# Patient Record
Sex: Female | Born: 1995 | Hispanic: Yes | Marital: Single
Health system: Southern US, Community
[De-identification: ages and names within clinical notes are randomized; demographics above are authoritative.]

---
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
## 2014-05-16 IMAGING — US US OB DETAIL+14 WK - NRPT [facility]
1 series · 14 of 28 positions shown · non-contrast
Comparison: none

[Series 1: us ob detail+14 wk - nrpt [facility] · 0.26mm/px · 14 of 91 slices shown]
[im 4/91]
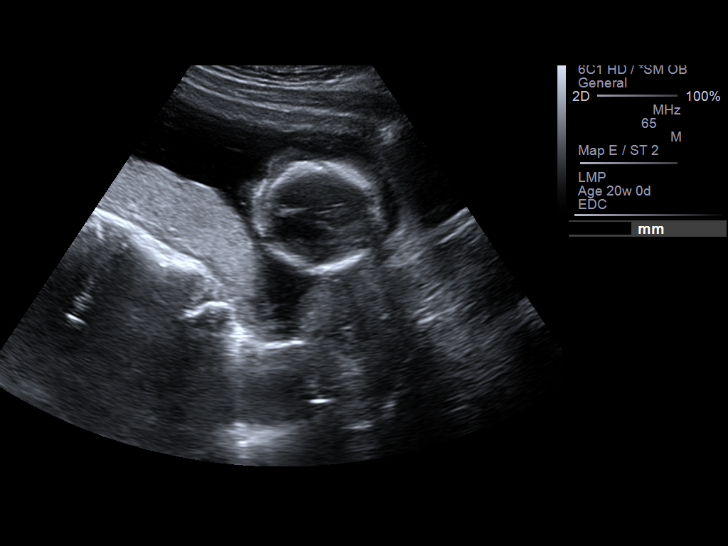
[im 11/91]
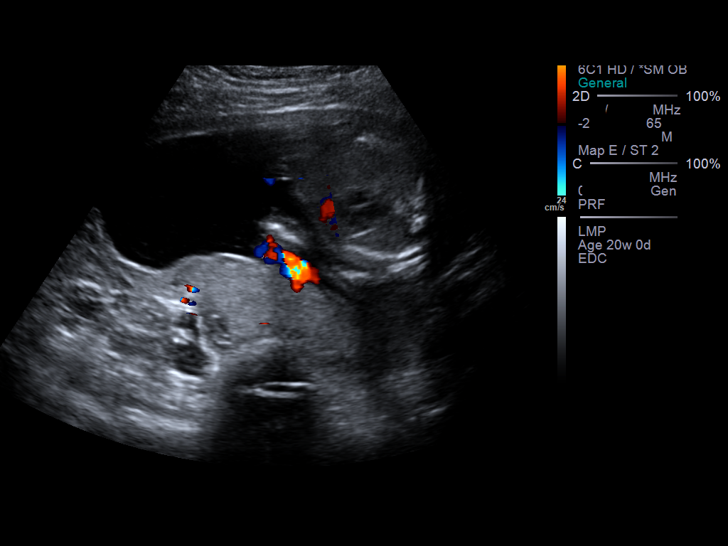
[im 17/91]
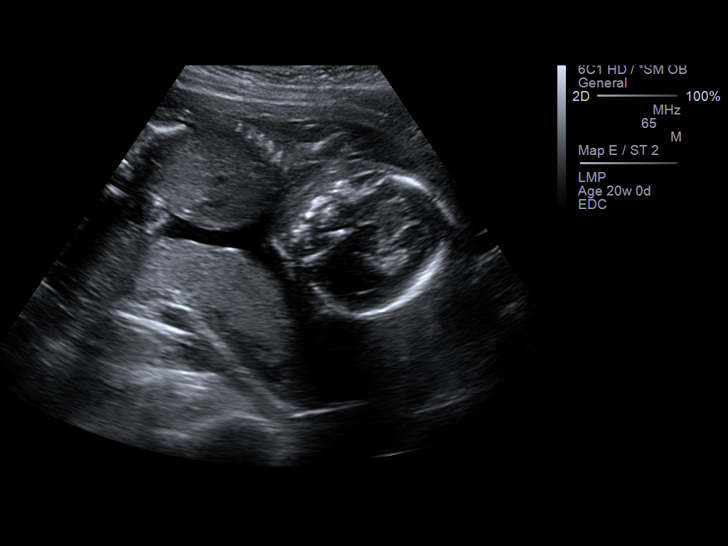
[im 24/91]
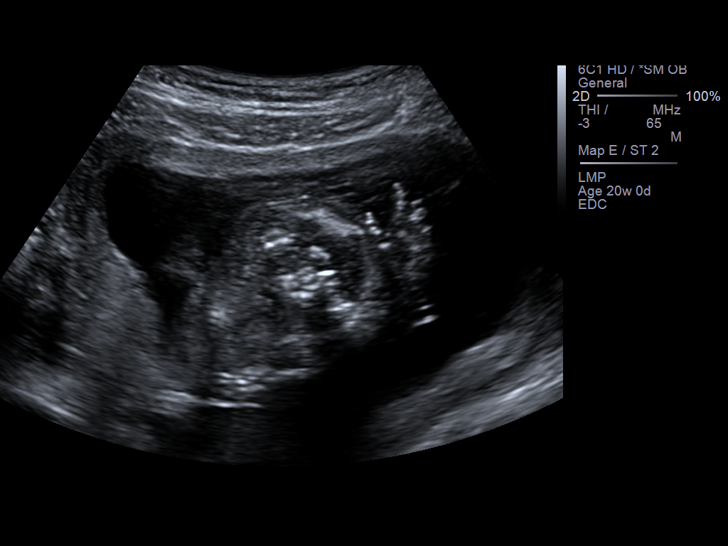
[im 31/91]
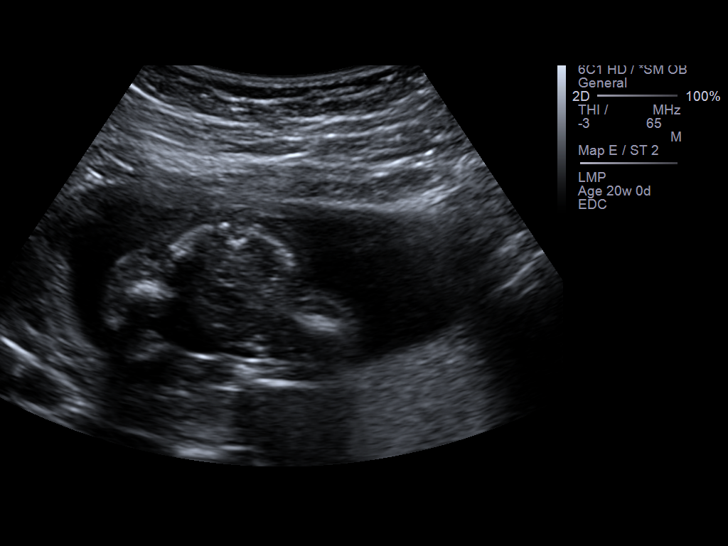
[im 37/91]
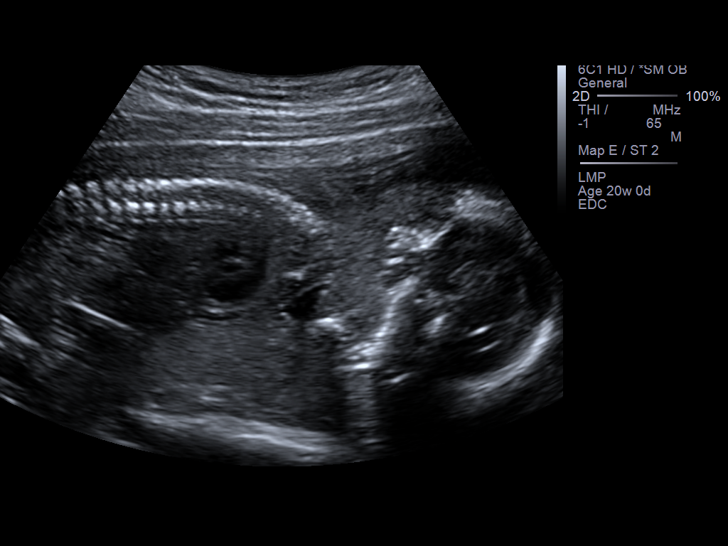
[im 44/91]
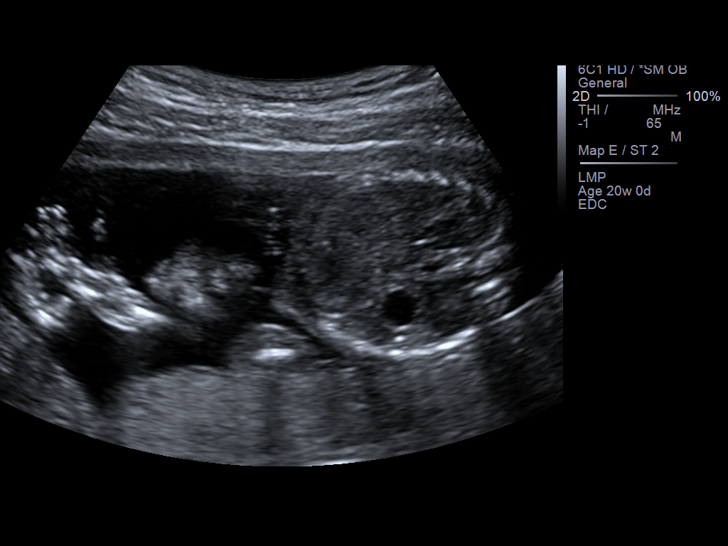
[im 51/91]
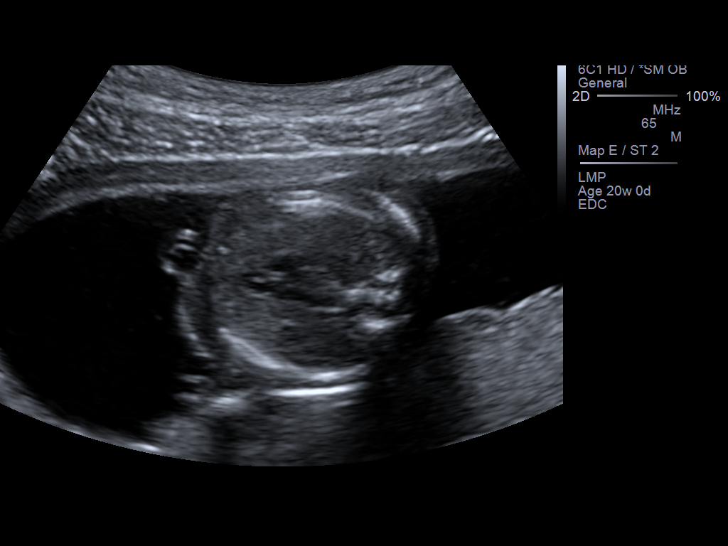
[im 57/91]
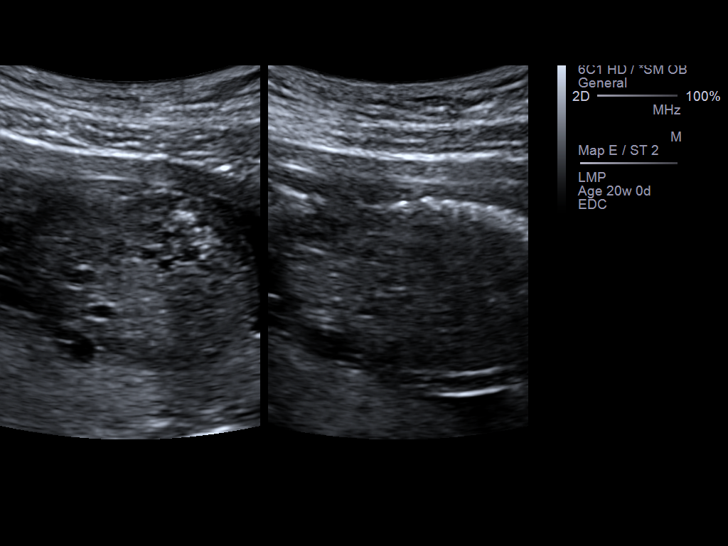
[im 64/91]
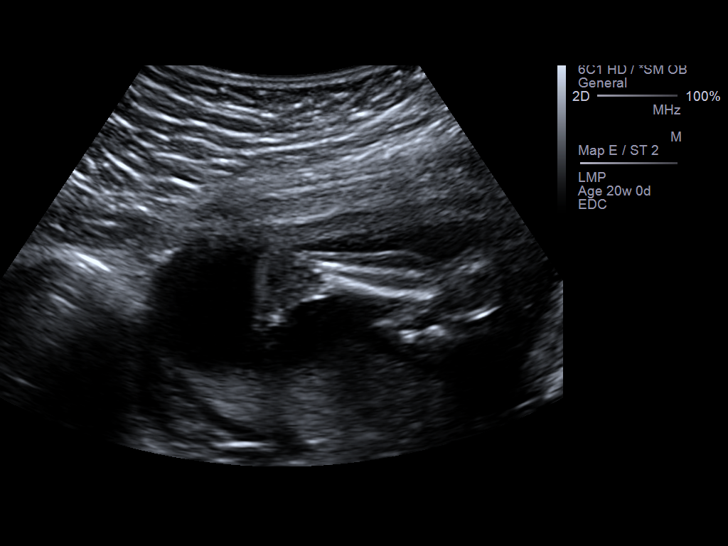
[im 71/91]
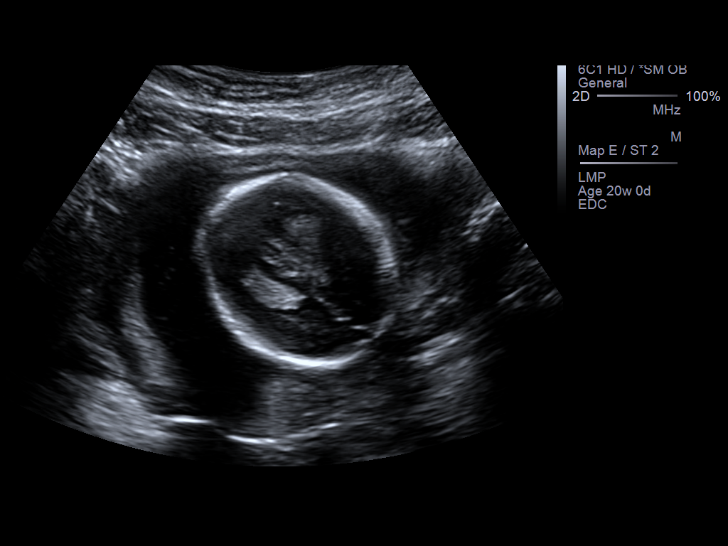
[im 77/91]
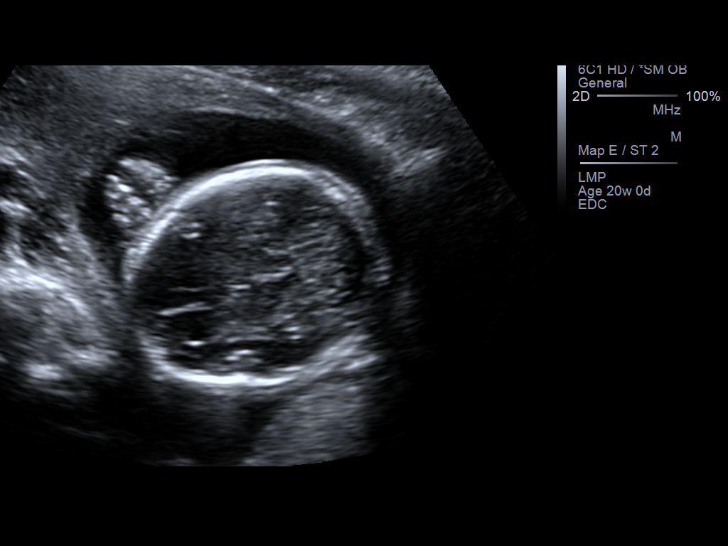
[im 84/91]
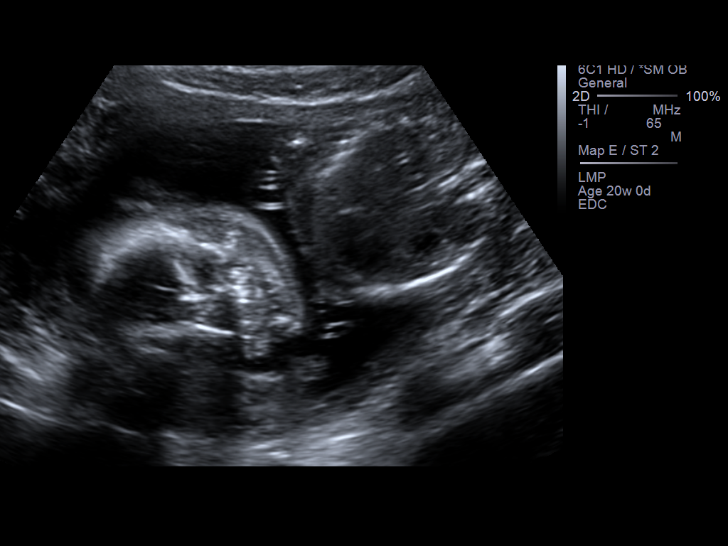
[im 91/91]
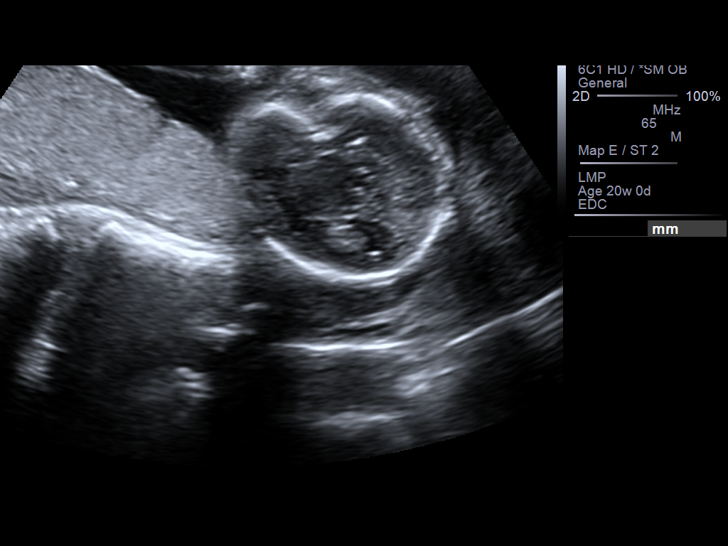

[14 of 28 positions shown; findings below may reference images not displayed]

IMAGES IMPORTED FROM THE [employer] WORKFLOW SYSTEM
NO DICTATION FOR STUDY

## 2015-01-19 ENCOUNTER — Ambulatory Visit
Admit: 2015-01-19 | Disposition: A | Discharge status: Home / Self Care | Payer: Self-pay | Attending: Pediatrics | Admitting: Pediatrics

## 2015-02-04 NOTE — Consult Note (Signed)
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 The patient underwent balloon valvuloplasty age 19 days of life. Since then, she has lead a normal life. She has signs or symptoms of heart disease. She has normal exercise tolerance. She denies CP, SOB, DOE, palpitations, syncope, near syncope, orthopnea, and PND.  2-3 years ago, she was told that she no longer needed SBE prophylaxis.  She had an echocardiogram on 08/20/2012. 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   Allergies:   No Known Allergies:   Vital Signs/Notes:  Nursing Vital Signs: **Vital Signs.:   14-Jul-14 08:46  Vital Signs Type Routine  Temperature Temperature (F) 98.4  Celsius 36.8  Pulse Pulse 73  Respirations Respirations 18  Systolic BP Systolic BP 122  Diastolic BP (mmHg) Diastolic BP (mmHg) 61  Mean BP 81  Pulse Ox % Pulse Ox % 98   Perinatal Consult:  Past Medical History cont'd Gyn - 1 episode of ovarian cyst(s) - no treatment Other surgery - dental work without complication Illness - none Transfusions - none Immunizations - none  Soc Hx Smoking / EtOH / Drugs - none  ROS - negative to review   Impression/Recommendations:  Impression 1) IUP at 20 0/7 weeks 2) Pulmonary Stenosis   a) S/P balloon valvuloplasty   b) Residual (or new) moderate pulmonary insufficiency   c) Normal right heart function (but with mild right vent. enlargement)   d) [place] class  I 3) Patients with mild and moderate pulmonary insufficiency and normal heart function tend to tolerate pregnancy adequately 4) 2-4% risk of fetal heart defect (any type)   Recommendations 1) Routine prenatal care 2) Fetal echocardiogram at 22-24 weeks' gestation (scheduled) 3) Echocardiogram (of patient's own heart) at 28-30 weeks' gestation (scheduled) 4) Assuming the patient's echocardiogram is similar to the one she had in 08/2012, then no further recommendations for this pregnancy 5) If echocardiogram shows severe or worsening of stenosis and/or deterioration of right heart function, please reconsult    Total Time Spent with Patient 30 minutes   >50% of visit spent in couseling/coordination of care yes   Office Use Only [id number]  Level 2 (30min) NEW office consult exp prob focused   Coding Description: MATERNAL CONDITIONS/HISTORY INDICATION(S).   Cardiac disease, maternal - Cardiomyopathy or CAD.  Electronic Signatures: Marcelino ScotBrancazio, Faisal Stradling (MD)  (Signed 14-Jul-14 11:35)  Authored: Referral, Allergies, Vital Signs/Notes, Consult, Impression, Billing, Coding Description   Last Updated: 14-Jul-14 11:35 by Marcelino ScotBrancazio, Yazmyne Sara (MD)

## 2015-02-22 NOTE — H&P (Signed)
L&D Evaluation:  History:  HPI 19 yo G1 at [redacted]w[redacted]d by D=19wk [place] derived [place] of 09/14/2013 presenting from [facility] for evaluation of elevated blood pressures.  The patient was noted to have had mildly elevated blood pressures since 36 weeks (118-144/69-91).  09/14/2013 patient noted to have BP 144/88, +1 proteinuria, and Pr/Cr ratio fo 517.  Prior Pr/Cr ratio on 09/11/13 ws 172.  The ptient is asymptomatic, deneis HA, vision changes, RUQ or epigastric pain, increased or generalized edema.  She has noted some mild shortness of breath.  No chest pain.  +FM, no LOF, no VB, no ctx.  Weight gain thus far this pregnancy 38lbs, only 4lbs of which were in the last month.  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  She has been asymptomatic since and has had regular cardiology follow up at [facility].  Most recent echocardiogram on 08/20/2012 showed normal systemic and pulmonary venous return, normal right and left atrial size, no evidence of ASD or VSD, mild right ventricular enlargement with preserved biventricular function, turbulen flow across pulmonary valve but normal velocity, moderate pulmonary insufficiency, milar dilation of the main pulmonary artery.  She was seen back by Dr. 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  LVEF is 67%   Presents with PIH evaluation   Patient's Medical History Pumonary stenosis s/p pior valvulopasty   Patient's Surgical History ballon valvuloplasty   Medications Pre Natal Vitamins   Allergies NKDA   Social History none   Family History Non-Contributory   ROS:  ROS All systems were reviewed.  HEENT, CNS, GI, GU, Respiratory, CV, Renal and Musculoskeletal systems were found to be normal.   Exam:  Vital Signs BP >140/90   156-167/81-94   Urine Protein Pr/Cr ratio pending   General no apparent distress   Mental Status clear   Chest clear   Heart normal sinus rhythm   Abdomen gravid, non-tender   Fetal Position vtx   Edema trace BLE pretibial edema   Reflexes 1+   Pelvic 1/60/-1   Mebranes Intact   FHT 120, moderate, +accels, no decels   Ucx irregular, patient not feeling q5-56min   Impression:  Impression evaluation for PIH   Plan:  Plan EFM/NST, monitor BP, PIH panel   Comments 1) Elevated blood pressures - PIH panel OK and Pr/Cr ratio 800 mgm.  Based on ACOG Hypertension in Pregnancy bulletin at the very least meets criteria for pre-eclampsia without severe features.  However, pending serial BP if further severe range blood pressures may rule in for severe based on BP.      - will move towards delivery as patient greater than [redacted] weeks gestation  2) Pulmonary stenosis - patient does have some moderate pulmonary insufficiency by echo with mild right ventricular enlargement.  [place] class I prior to pregnancy but given pregnancy and some SOB may meet criteria of class II currently.  Question is with her current blood pressures, fluid shifts expected with delivery, if patient is still candidate for delivery at our institution or requires closer monitoring.  In addition effects of various antihypertensives on cardiac function is also something that we do not routinely take into consideration and would benefit from guidance by cardiology or MFM.     - will contact [facility] to discuss transfer vs  appropriate managment of BP's in labor  3) Fetus - category I tracing. fetal echo at 22 weeks normal  4) PNL O pos / ABSC neg / MMR 04/13/96 & 04/07/99 / Varicella vaccination 04/13/96 and 11/20/05 / HIV neg / RPR NR / HBsAg neg / Hgb AA / GC & CT neg & neg / 1-hr OGTT 76 / GBS bacteruria  5) Immunizations - TDAP received 06/25/2013  6) Disposition pending labs and DP input   Electronic Signatures  for Addendum Section:  Dorthula Nettles (MD) (Signed Addendum 03-Dec-14 19:00)  Spoke with Dr Leward Quan L&D attening at [facility] regarding patients case.  Her reivewed patient echocardiogram from 07/15/2013.  No additional precautions recommended.  States that both labetalol or hydralazine are acceptable for control of BP's, only other recommendation is to run patient euvolemic during the course of her labor.  Dorthula Nettles (MD) (Signed Addendum 03-Dec-14 19:19)  Spoke with Dr. Oletta Lamas at [facility] as well, basically echoes managment plan by Dr. Leward Quan.  Safe to get epidural and preload associated with this, ok for both hydralazine, labetalol, and magnesium sulfate if indicated   Electronic Signatures: Karene Fry (CNM)  (Signed 03-Dec-14 23:56)  Authored: L&D Evaluation Dorthula Nettles (MD)  (Signed 03-Dec-14 19:03)  Authored: L&D Evaluation   Last Updated: 03-Dec-14 23:56 by Karene Fry (CNM)

## 2016-02-29 ENCOUNTER — Ambulatory Visit: Payer: Medicaid Other | Attending: Pediatrics | Admitting: Pediatrics

## 2016-02-29 DIAGNOSIS — Q221 Congenital pulmonary valve stenosis: Secondary | ICD-10-CM | POA: Insufficient documentation

## 2017-04-03 ENCOUNTER — Ambulatory Visit: Payer: Medicaid Other | Attending: Pediatrics | Admitting: Pediatrics

## 2017-04-03 DIAGNOSIS — Q221 Congenital pulmonary valve stenosis: Secondary | ICD-10-CM | POA: Insufficient documentation
